# Patient Record
Sex: Female | Born: 1945 | Race: White | Hispanic: No | Smoking: Never smoker
Health system: Southern US, Community
[De-identification: ages and names within clinical notes are randomized; demographics above are authoritative.]

## PROBLEM LIST (undated history)

## (undated) DIAGNOSIS — Z86010 Personal history of colonic polyps: Secondary | ICD-10-CM

## (undated) DIAGNOSIS — K222 Esophageal obstruction: Secondary | ICD-10-CM

## (undated) DIAGNOSIS — M1990 Unspecified osteoarthritis, unspecified site: Secondary | ICD-10-CM

## (undated) DIAGNOSIS — E785 Hyperlipidemia, unspecified: Secondary | ICD-10-CM

## (undated) HISTORY — PX: COLONOSCOPY: SHX174

## (undated) HISTORY — PX: ESOPHAGOGASTRODUODENOSCOPY: SHX1529

## (undated) HISTORY — PX: CHOLECYSTECTOMY: SHX55

---

## 2009-06-21 ENCOUNTER — Ambulatory Visit: Payer: Self-pay | Admitting: Internal Medicine

## 2010-06-22 ENCOUNTER — Ambulatory Visit: Payer: Self-pay | Admitting: Internal Medicine

## 2011-06-28 ENCOUNTER — Ambulatory Visit: Payer: Self-pay | Admitting: Internal Medicine

## 2012-07-15 ENCOUNTER — Ambulatory Visit: Payer: Self-pay | Admitting: Internal Medicine

## 2012-08-11 ENCOUNTER — Ambulatory Visit: Payer: Self-pay | Admitting: Unknown Physician Specialty

## 2012-08-11 LAB — CBC WITH DIFFERENTIAL/PLATELET
Basophil %: 1 %
Eosinophil #: 0.2 10*3/uL (ref 0.0–0.7)
Eosinophil %: 2.2 %
HCT: 40.7 % (ref 35.0–47.0)
HGB: 13.6 g/dL (ref 12.0–16.0)
Lymphocyte #: 2.7 10*3/uL (ref 1.0–3.6)
Lymphocyte %: 33.3 %
MCHC: 33.3 g/dL (ref 32.0–36.0)
MCV: 86 fL (ref 80–100)
Monocyte #: 0.7 x10 3/mm (ref 0.2–0.9)
Monocyte %: 8.5 %
Neutrophil #: 4.5 10*3/uL (ref 1.4–6.5)
Neutrophil %: 55 %
Platelet: 380 10*3/uL (ref 150–440)
WBC: 8.1 10*3/uL (ref 3.6–11.0)

## 2012-08-11 LAB — PROTIME-INR: Prothrombin Time: 12.4 secs (ref 11.5–14.7)

## 2012-08-12 LAB — PATHOLOGY REPORT

## 2013-07-19 ENCOUNTER — Ambulatory Visit: Payer: Self-pay

## 2014-08-23 ENCOUNTER — Ambulatory Visit: Payer: Self-pay | Admitting: Internal Medicine

## 2015-03-20 DIAGNOSIS — H1033 Unspecified acute conjunctivitis, bilateral: Secondary | ICD-10-CM | POA: Diagnosis not present

## 2015-03-20 DIAGNOSIS — J988 Other specified respiratory disorders: Secondary | ICD-10-CM | POA: Diagnosis not present

## 2015-05-26 DIAGNOSIS — E780 Pure hypercholesterolemia: Secondary | ICD-10-CM | POA: Diagnosis not present

## 2015-05-26 DIAGNOSIS — M159 Polyosteoarthritis, unspecified: Secondary | ICD-10-CM | POA: Diagnosis not present

## 2015-06-02 DIAGNOSIS — M170 Bilateral primary osteoarthritis of knee: Secondary | ICD-10-CM | POA: Diagnosis not present

## 2015-06-02 DIAGNOSIS — E780 Pure hypercholesterolemia: Secondary | ICD-10-CM | POA: Diagnosis not present

## 2015-08-17 DIAGNOSIS — Z86010 Personal history of colonic polyps: Secondary | ICD-10-CM | POA: Diagnosis not present

## 2015-09-04 DIAGNOSIS — I10 Essential (primary) hypertension: Secondary | ICD-10-CM | POA: Diagnosis not present

## 2015-09-04 DIAGNOSIS — F411 Generalized anxiety disorder: Secondary | ICD-10-CM | POA: Diagnosis not present

## 2015-09-04 DIAGNOSIS — M170 Bilateral primary osteoarthritis of knee: Secondary | ICD-10-CM | POA: Diagnosis not present

## 2015-09-15 ENCOUNTER — Encounter: Payer: Self-pay | Admitting: *Deleted

## 2015-09-18 ENCOUNTER — Ambulatory Visit: Payer: Commercial Managed Care - HMO | Admitting: *Deleted

## 2015-09-18 ENCOUNTER — Ambulatory Visit
Admission: RE | Admit: 2015-09-18 | Discharge: 2015-09-18 | Disposition: A | Discharge status: Home / Self Care | Payer: Commercial Managed Care - HMO | Source: Ambulatory Visit | Attending: Unknown Physician Specialty | Admitting: Unknown Physician Specialty

## 2015-09-18 ENCOUNTER — Encounter: Payer: Self-pay | Admitting: *Deleted

## 2015-09-18 ENCOUNTER — Encounter
Admission: RE | Disposition: A | Discharge status: Home / Self Care | Payer: Self-pay | Source: Ambulatory Visit | Attending: Unknown Physician Specialty

## 2015-09-18 DIAGNOSIS — Z9049 Acquired absence of other specified parts of digestive tract: Secondary | ICD-10-CM | POA: Insufficient documentation

## 2015-09-18 DIAGNOSIS — K5730 Diverticulosis of large intestine without perforation or abscess without bleeding: Secondary | ICD-10-CM | POA: Insufficient documentation

## 2015-09-18 DIAGNOSIS — D123 Benign neoplasm of transverse colon: Secondary | ICD-10-CM | POA: Insufficient documentation

## 2015-09-18 DIAGNOSIS — Z6829 Body mass index (BMI) 29.0-29.9, adult: Secondary | ICD-10-CM | POA: Diagnosis not present

## 2015-09-18 DIAGNOSIS — I10 Essential (primary) hypertension: Secondary | ICD-10-CM | POA: Insufficient documentation

## 2015-09-18 DIAGNOSIS — Z79899 Other long term (current) drug therapy: Secondary | ICD-10-CM | POA: Diagnosis not present

## 2015-09-18 DIAGNOSIS — K648 Other hemorrhoids: Secondary | ICD-10-CM | POA: Diagnosis not present

## 2015-09-18 DIAGNOSIS — M1990 Unspecified osteoarthritis, unspecified site: Secondary | ICD-10-CM | POA: Insufficient documentation

## 2015-09-18 DIAGNOSIS — K640 First degree hemorrhoids: Secondary | ICD-10-CM | POA: Diagnosis not present

## 2015-09-18 DIAGNOSIS — Z86010 Personal history of colonic polyps: Secondary | ICD-10-CM | POA: Diagnosis not present

## 2015-09-18 DIAGNOSIS — E785 Hyperlipidemia, unspecified: Secondary | ICD-10-CM | POA: Insufficient documentation

## 2015-09-18 DIAGNOSIS — E669 Obesity, unspecified: Secondary | ICD-10-CM | POA: Insufficient documentation

## 2015-09-18 DIAGNOSIS — E119 Type 2 diabetes mellitus without complications: Secondary | ICD-10-CM | POA: Diagnosis not present

## 2015-09-18 DIAGNOSIS — K5790 Diverticulosis of intestine, part unspecified, without perforation or abscess without bleeding: Secondary | ICD-10-CM | POA: Diagnosis not present

## 2015-09-18 DIAGNOSIS — K635 Polyp of colon: Secondary | ICD-10-CM | POA: Diagnosis not present

## 2015-09-18 HISTORY — DX: Unspecified osteoarthritis, unspecified site: M19.90

## 2015-09-18 HISTORY — DX: Hyperlipidemia, unspecified: E78.5

## 2015-09-18 HISTORY — DX: Personal history of colonic polyps: Z86.010

## 2015-09-18 HISTORY — PX: COLONOSCOPY WITH PROPOFOL: SHX5780

## 2015-09-18 HISTORY — DX: Esophageal obstruction: K22.2

## 2015-09-18 LAB — GLUCOSE, CAPILLARY: Glucose-Capillary: 91 mg/dL (ref 65–99)

## 2015-09-18 SURGERY — COLONOSCOPY WITH PROPOFOL
Anesthesia: General

## 2015-09-18 MED ORDER — FENTANYL CITRATE (PF) 100 MCG/2ML IJ SOLN
INTRAMUSCULAR | Status: DC | PRN
  Administered 2015-09-18: 50 ug via INTRAVENOUS

## 2015-09-18 MED ORDER — PROPOFOL 10 MG/ML IV BOLUS
INTRAVENOUS | Status: DC | PRN
  Administered 2015-09-18: 14 mg via INTRAVENOUS
  Administered 2015-09-18: 35 mg via INTRAVENOUS

## 2015-09-18 MED ORDER — PROPOFOL 500 MG/50ML IV EMUL
INTRAVENOUS | Status: DC | PRN
  Administered 2015-09-18: 100 ug/kg/min via INTRAVENOUS

## 2015-09-18 MED ORDER — SODIUM CHLORIDE 0.9 % IV SOLN: INTRAVENOUS | Status: DC

## 2015-09-18 MED ORDER — SODIUM CHLORIDE 0.9 % IV SOLN
INTRAVENOUS | Status: DC
  Administered 2015-09-18: 13:00:00 via INTRAVENOUS

## 2015-09-18 NOTE — Anesthesia Preprocedure Evaluation (Signed)
Anesthesia Evaluation  Patient identified by MRN, date of birth, ID band Patient awake    Reviewed: Allergy & Precautions, NPO status , Patient's Chart, lab work & pertinent test results  Airway Mallampati: II  TM Distance: >3 FB Neck ROM: Limited    Dental  (+) Upper Dentures, Lower Dentures   Pulmonary    Pulmonary exam normal        Cardiovascular Exercise Tolerance: Good hypertension, Pt. on medications Normal cardiovascular exam     Neuro/Psych    GI/Hepatic   Endo/Other  diabetes, Type 2Diet controlled DM--BG 91.  Renal/GU      Musculoskeletal  (+) Arthritis , Osteoarthritis,    Abdominal (+) + obese,  Abdomen: soft.    Peds  Hematology   Anesthesia Other Findings   Reproductive/Obstetrics                             Anesthesia Physical Anesthesia Plan  ASA: III  Anesthesia Plan: General   Post-op Pain Management:    Induction: Intravenous  Airway Management Planned: Nasal Cannula  Additional Equipment:   Intra-op Plan:   Post-operative Plan:   Informed Consent: I have reviewed the patients History and Physical, chart, labs and discussed the procedure including the risks, benefits and alternatives for the proposed anesthesia with the patient or authorized representative who has indicated his/her understanding and acceptance.     Plan Discussed with: CRNA  Anesthesia Plan Comments:         Anesthesia Quick Evaluation

## 2015-09-18 NOTE — Transfer of Care (Signed)
Immediate Anesthesia Transfer of Care Note  Patient: Jeanne Bennett  Procedure(s) Performed: Procedure(s): COLONOSCOPY WITH PROPOFOL (N/A)  Patient Location: PACU  Anesthesia Type:General  Level of Consciousness: sedated  Airway & Oxygen Therapy: Patient Spontanous Breathing and Patient connected to nasal cannula oxygen  Post-op Assessment: Report given to RN and Post -op Vital signs reviewed and stable  Post vital signs: Reviewed and stable  Last Vitals:  Filed Vitals:   09/18/15 1347  BP: 112/66  Pulse:   Temp: 36.1 C  Resp: 17    Complications: No apparent anesthesia complications

## 2015-09-18 NOTE — H&P (Signed)
   Primary Care Physician:  Tracie Harrier, MD Primary Gastroenterologist:  Dr. Vira Agar  Pre-Procedure History & Physical: HPI:  Jeanne Bennett is a 69 y.o. female is here for an colonoscopy.   Past Medical History  Diagnosis Date  . Arthritis   . Hyperlipidemia   . Hx of adenomatous colonic polyps   . Schatzki's ring     Past Surgical History  Procedure Laterality Date  . Cholecystectomy    . Colonoscopy    . Esophagogastroduodenoscopy      Prior to Admission medications   Medication Sig Start Date End Date Taking? Authorizing Jereld Presti  amLODipine (NORVASC) 2.5 MG tablet Take 2.5 mg by mouth daily.   Yes Historical Aleyah Balik, MD  calcium carbonate (OSCAL) 1500 (600 CA) MG TABS tablet Take by mouth 2 (two) times daily with a meal.   Yes Historical Gloyd Happ, MD  Flaxseed, Linseed, (FLAXSEED OIL) 1000 MG CAPS Take 1,000 mg by mouth.   Yes Historical Dandrae Kustra, MD  Garlic 081 MG TABS Take 1,000 mg by mouth.   Yes Historical Leonda Cristo, MD  lovastatin (MEVACOR) 40 MG tablet Take 40 mg by mouth at bedtime.   Yes Historical Aliene Tamura, MD  vitamin E 400 UNIT capsule Take 400 Units by mouth daily.   Yes Historical Franki Alcaide, MD    Allergies as of 08/18/2015  . (Not on File)    History reviewed. No pertinent family history.  Social History   Social History  . Marital Status: Widowed    Spouse Name: N/A  . Number of Children: N/A  . Years of Education: N/A   Occupational History  . Not on file.   Social History Main Topics  . Smoking status: Never Smoker   . Smokeless tobacco: Never Used  . Alcohol Use: No  . Drug Use: No  . Sexual Activity: Not on file   Other Topics Concern  . Not on file   Social History Narrative    Review of Systems: See HPI, otherwise negative ROS  Physical Exam: BP 153/86 mmHg  Pulse 68  Temp(Src) 97.7 F (36.5 C) (Tympanic)  Resp 14  Ht 5\' 2"  (1.575 m)  Wt 72.576 kg (160 lb)  BMI 29.26 kg/m2  SpO2 100% General:   Alert,   pleasant and cooperative in NAD Head:  Normocephalic and atraumatic. Neck:  Supple; no masses or thyromegaly. Lungs:  Clear throughout to auscultation.    Heart:  Regular rate and rhythm. Abdomen:  Soft, nontender and nondistended. Normal bowel sounds, without guarding, and without rebound.   Neurologic:  Alert and  oriented x4;  grossly normal neurologically.  Impression/Plan: [name] is here for an colonoscopy to be performed for [facility] colon polyps  Risks, benefits, limitations, and alternatives regarding  colonoscopy have been reviewed with the patient.  Questions have been answered.  All parties agreeable.   Gaylyn Cheers, MD  09/18/2015, 1:21 PM

## 2015-09-18 NOTE — Anesthesia Postprocedure Evaluation (Signed)
  Anesthesia Post-op Note  Patient: Jeanne Bennett  Procedure(s) Performed: Procedure(s): COLONOSCOPY WITH PROPOFOL (N/A)  Anesthesia type:General  Patient location: PACU  Post pain: Pain level controlled  Post assessment: Post-op Vital signs reviewed, Patient's Cardiovascular Status Stable, Respiratory Function Stable, Patent Airway and No signs of Nausea or vomiting  Post vital signs: Reviewed and stable  Last Vitals:  Filed Vitals:   09/18/15 1358  BP: 132/83  Pulse: 35  Temp:   Resp: 13    Level of consciousness: awake, alert  and patient cooperative  Complications: No apparent anesthesia complications

## 2015-09-18 NOTE — Op Note (Signed)
[facility] Patient Name: Jeanne Bennett Procedure Date: 09/18/2015 1:18 PM MRN: [id number] Account #: [id number] Date of Birth: 08/06/1946 Admit Type: Outpatient Age: 69 Room: [facility] ENDO ROOM 1 Gender: Female Note Status: Finalized Procedure:         Colonoscopy Indications:       Personal history of colonic polyps Providers:         Manya Silvas, MD Referring MD:      Tracie Harrier, MD (Referring MD) Medicines:         Propofol per Anesthesia Complications:     No immediate complications. Procedure:         Pre-Anesthesia Assessment:                    - After reviewing the risks and benefits, the patient was                     deemed in satisfactory condition to undergo the procedure.                    After obtaining informed consent, the colonoscope was                     passed under direct vision. Throughout the procedure, the                     patient's blood pressure, pulse, and oxygen saturations                     were monitored continuously. The Colonoscope was                     introduced through the anus and advanced to the the cecum,                     identified by appendiceal orifice and ileocecal valve. The                     colonoscopy was performed without difficulty. The patient                     tolerated the procedure well. The quality of the bowel                     preparation was excellent. Findings:      A diminutive polyp was found in the transverse colon. The polyp was       sessile. The polyp was removed with a jumbo cold forceps. Resection and       retrieval were complete.      Multiple small and large-mouthed diverticula were found in the sigmoid       colon and in the descending colon.      Internal hemorrhoids were found during endoscopy. The hemorrhoids were       small and Grade I (internal hemorrhoids that do not prolapse).      The exam was otherwise without abnormality. Impression:         - One diminutive polyp in the transverse colon. Resected                     and retrieved.                    - Diverticulosis in the sigmoid colon and in the  descending colon.                    - Internal hemorrhoids.                    - The examination was otherwise normal. Recommendation:    - Await pathology results. Manya Silvas, MD 09/18/2015 1:48:45 PM This report has been signed electronically. Number of Addenda: 0 Note Initiated On: 09/18/2015 1:18 PM Scope Withdrawal Time: 0 hours 10 minutes 8 seconds  Total Procedure Duration: 0 hours 16 minutes 29 seconds       [facility]

## 2015-09-19 ENCOUNTER — Encounter: Payer: Self-pay | Admitting: Unknown Physician Specialty

## 2015-09-19 LAB — SURGICAL PATHOLOGY

## 2015-09-28 DIAGNOSIS — H5210 Myopia, unspecified eye: Secondary | ICD-10-CM | POA: Diagnosis not present

## 2015-09-28 DIAGNOSIS — H524 Presbyopia: Secondary | ICD-10-CM | POA: Diagnosis not present

## 2015-11-24 DIAGNOSIS — E7800 Pure hypercholesterolemia, unspecified: Secondary | ICD-10-CM | POA: Diagnosis not present

## 2015-11-24 DIAGNOSIS — M170 Bilateral primary osteoarthritis of knee: Secondary | ICD-10-CM | POA: Diagnosis not present

## 2015-12-01 DIAGNOSIS — E7800 Pure hypercholesterolemia, unspecified: Secondary | ICD-10-CM | POA: Diagnosis not present

## 2015-12-01 DIAGNOSIS — Z86010 Personal history of colonic polyps: Secondary | ICD-10-CM | POA: Diagnosis not present

## 2015-12-01 DIAGNOSIS — M170 Bilateral primary osteoarthritis of knee: Secondary | ICD-10-CM | POA: Diagnosis not present

## 2015-12-01 DIAGNOSIS — Z0000 Encounter for general adult medical examination without abnormal findings: Secondary | ICD-10-CM | POA: Diagnosis not present

## 2015-12-01 DIAGNOSIS — F411 Generalized anxiety disorder: Secondary | ICD-10-CM | POA: Diagnosis not present

## 2015-12-13 DIAGNOSIS — R8299 Other abnormal findings in urine: Secondary | ICD-10-CM | POA: Diagnosis not present

## 2016-03-08 ENCOUNTER — Other Ambulatory Visit: Payer: Self-pay | Admitting: Internal Medicine

## 2016-03-08 DIAGNOSIS — Z1231 Encounter for screening mammogram for malignant neoplasm of breast: Secondary | ICD-10-CM

## 2016-03-21 ENCOUNTER — Ambulatory Visit
Admission: RE | Admit: 2016-03-21 | Discharge: 2016-03-21 | Disposition: A | Discharge status: Home / Self Care | Payer: Commercial Managed Care - HMO | Source: Ambulatory Visit | Attending: Internal Medicine | Admitting: Internal Medicine

## 2016-03-21 ENCOUNTER — Other Ambulatory Visit: Payer: Self-pay | Admitting: Internal Medicine

## 2016-03-21 DIAGNOSIS — Z1231 Encounter for screening mammogram for malignant neoplasm of breast: Secondary | ICD-10-CM | POA: Diagnosis not present

## 2016-05-06 DIAGNOSIS — J40 Bronchitis, not specified as acute or chronic: Secondary | ICD-10-CM | POA: Diagnosis not present

## 2016-05-24 DIAGNOSIS — Z0000 Encounter for general adult medical examination without abnormal findings: Secondary | ICD-10-CM | POA: Diagnosis not present

## 2016-05-24 DIAGNOSIS — M170 Bilateral primary osteoarthritis of knee: Secondary | ICD-10-CM | POA: Diagnosis not present

## 2016-05-24 DIAGNOSIS — E7800 Pure hypercholesterolemia, unspecified: Secondary | ICD-10-CM | POA: Diagnosis not present

## 2016-05-24 DIAGNOSIS — Z86010 Personal history of colonic polyps: Secondary | ICD-10-CM | POA: Diagnosis not present

## 2016-05-24 DIAGNOSIS — F411 Generalized anxiety disorder: Secondary | ICD-10-CM | POA: Diagnosis not present

## 2016-05-31 DIAGNOSIS — M170 Bilateral primary osteoarthritis of knee: Secondary | ICD-10-CM | POA: Diagnosis not present

## 2016-05-31 DIAGNOSIS — I10 Essential (primary) hypertension: Secondary | ICD-10-CM | POA: Diagnosis not present

## 2016-05-31 DIAGNOSIS — F411 Generalized anxiety disorder: Secondary | ICD-10-CM | POA: Diagnosis not present

## 2016-05-31 DIAGNOSIS — E782 Mixed hyperlipidemia: Secondary | ICD-10-CM | POA: Diagnosis not present

## 2016-11-04 DIAGNOSIS — H00022 Hordeolum internum right lower eyelid: Secondary | ICD-10-CM | POA: Diagnosis not present

## 2016-11-26 DIAGNOSIS — I10 Essential (primary) hypertension: Secondary | ICD-10-CM | POA: Diagnosis not present

## 2016-11-26 DIAGNOSIS — M170 Bilateral primary osteoarthritis of knee: Secondary | ICD-10-CM | POA: Diagnosis not present

## 2016-11-26 DIAGNOSIS — F411 Generalized anxiety disorder: Secondary | ICD-10-CM | POA: Diagnosis not present

## 2016-12-03 DIAGNOSIS — M170 Bilateral primary osteoarthritis of knee: Secondary | ICD-10-CM | POA: Diagnosis not present

## 2016-12-03 DIAGNOSIS — E7800 Pure hypercholesterolemia, unspecified: Secondary | ICD-10-CM | POA: Diagnosis not present

## 2016-12-03 DIAGNOSIS — F439 Reaction to severe stress, unspecified: Secondary | ICD-10-CM | POA: Diagnosis not present

## 2016-12-03 DIAGNOSIS — Z0000 Encounter for general adult medical examination without abnormal findings: Secondary | ICD-10-CM | POA: Diagnosis not present

## 2016-12-03 DIAGNOSIS — I10 Essential (primary) hypertension: Secondary | ICD-10-CM | POA: Diagnosis not present

## 2017-02-12 ENCOUNTER — Other Ambulatory Visit: Payer: Self-pay | Admitting: Internal Medicine

## 2017-02-12 DIAGNOSIS — Z1231 Encounter for screening mammogram for malignant neoplasm of breast: Secondary | ICD-10-CM

## 2017-03-27 ENCOUNTER — Ambulatory Visit
Admission: RE | Admit: 2017-03-27 | Discharge: 2017-03-27 | Disposition: A | Discharge status: Home / Self Care | Payer: Medicare HMO | Source: Ambulatory Visit | Attending: Internal Medicine | Admitting: Internal Medicine

## 2017-03-27 DIAGNOSIS — Z1231 Encounter for screening mammogram for malignant neoplasm of breast: Secondary | ICD-10-CM | POA: Insufficient documentation

## 2017-05-27 DIAGNOSIS — Z0000 Encounter for general adult medical examination without abnormal findings: Secondary | ICD-10-CM | POA: Diagnosis not present

## 2017-05-27 DIAGNOSIS — E7800 Pure hypercholesterolemia, unspecified: Secondary | ICD-10-CM | POA: Diagnosis not present

## 2017-05-27 DIAGNOSIS — F439 Reaction to severe stress, unspecified: Secondary | ICD-10-CM | POA: Diagnosis not present

## 2017-05-27 DIAGNOSIS — M170 Bilateral primary osteoarthritis of knee: Secondary | ICD-10-CM | POA: Diagnosis not present

## 2017-05-27 DIAGNOSIS — I10 Essential (primary) hypertension: Secondary | ICD-10-CM | POA: Diagnosis not present

## 2017-06-03 DIAGNOSIS — I10 Essential (primary) hypertension: Secondary | ICD-10-CM | POA: Diagnosis not present

## 2017-06-03 DIAGNOSIS — F411 Generalized anxiety disorder: Secondary | ICD-10-CM | POA: Diagnosis not present

## 2017-06-03 DIAGNOSIS — E7800 Pure hypercholesterolemia, unspecified: Secondary | ICD-10-CM | POA: Diagnosis not present

## 2017-06-03 DIAGNOSIS — Z0000 Encounter for general adult medical examination without abnormal findings: Secondary | ICD-10-CM | POA: Diagnosis not present

## 2017-06-03 DIAGNOSIS — M170 Bilateral primary osteoarthritis of knee: Secondary | ICD-10-CM | POA: Diagnosis not present

## 2017-07-08 DIAGNOSIS — H524 Presbyopia: Secondary | ICD-10-CM | POA: Diagnosis not present

## 2017-11-27 DIAGNOSIS — M170 Bilateral primary osteoarthritis of knee: Secondary | ICD-10-CM | POA: Diagnosis not present

## 2017-11-27 DIAGNOSIS — E7800 Pure hypercholesterolemia, unspecified: Secondary | ICD-10-CM | POA: Diagnosis not present

## 2017-11-27 DIAGNOSIS — F411 Generalized anxiety disorder: Secondary | ICD-10-CM | POA: Diagnosis not present

## 2017-11-27 DIAGNOSIS — I10 Essential (primary) hypertension: Secondary | ICD-10-CM | POA: Diagnosis not present

## 2018-01-19 ENCOUNTER — Other Ambulatory Visit: Payer: Self-pay | Admitting: Internal Medicine

## 2018-01-19 DIAGNOSIS — Z0000 Encounter for general adult medical examination without abnormal findings: Secondary | ICD-10-CM | POA: Diagnosis not present

## 2018-01-19 DIAGNOSIS — Z1231 Encounter for screening mammogram for malignant neoplasm of breast: Secondary | ICD-10-CM | POA: Diagnosis not present

## 2018-01-19 DIAGNOSIS — L0390 Cellulitis, unspecified: Secondary | ICD-10-CM | POA: Diagnosis not present

## 2018-01-19 DIAGNOSIS — Z1239 Encounter for other screening for malignant neoplasm of breast: Secondary | ICD-10-CM

## 2018-01-19 DIAGNOSIS — E782 Mixed hyperlipidemia: Secondary | ICD-10-CM | POA: Diagnosis not present

## 2018-01-19 DIAGNOSIS — I10 Essential (primary) hypertension: Secondary | ICD-10-CM | POA: Diagnosis not present

## 2018-01-19 DIAGNOSIS — F411 Generalized anxiety disorder: Secondary | ICD-10-CM | POA: Diagnosis not present

## 2018-01-19 DIAGNOSIS — M170 Bilateral primary osteoarthritis of knee: Secondary | ICD-10-CM | POA: Diagnosis not present

## 2018-03-31 ENCOUNTER — Ambulatory Visit
Admission: RE | Admit: 2018-03-31 | Discharge: 2018-03-31 | Disposition: A | Discharge status: Home / Self Care | Payer: Medicare HMO | Source: Ambulatory Visit | Attending: Internal Medicine | Admitting: Internal Medicine

## 2018-03-31 DIAGNOSIS — Z1239 Encounter for other screening for malignant neoplasm of breast: Secondary | ICD-10-CM

## 2018-03-31 DIAGNOSIS — Z1231 Encounter for screening mammogram for malignant neoplasm of breast: Secondary | ICD-10-CM | POA: Diagnosis not present

## 2018-07-23 DIAGNOSIS — I10 Essential (primary) hypertension: Secondary | ICD-10-CM | POA: Diagnosis not present

## 2018-07-23 DIAGNOSIS — E782 Mixed hyperlipidemia: Secondary | ICD-10-CM | POA: Diagnosis not present

## 2018-07-23 DIAGNOSIS — F411 Generalized anxiety disorder: Secondary | ICD-10-CM | POA: Diagnosis not present

## 2018-07-23 DIAGNOSIS — M170 Bilateral primary osteoarthritis of knee: Secondary | ICD-10-CM | POA: Diagnosis not present

## 2018-08-27 DIAGNOSIS — F411 Generalized anxiety disorder: Secondary | ICD-10-CM | POA: Diagnosis not present

## 2018-08-27 DIAGNOSIS — I10 Essential (primary) hypertension: Secondary | ICD-10-CM | POA: Diagnosis not present

## 2018-08-27 DIAGNOSIS — E782 Mixed hyperlipidemia: Secondary | ICD-10-CM | POA: Diagnosis not present

## 2018-08-27 DIAGNOSIS — M170 Bilateral primary osteoarthritis of knee: Secondary | ICD-10-CM | POA: Diagnosis not present

## 2018-08-27 DIAGNOSIS — Z0000 Encounter for general adult medical examination without abnormal findings: Secondary | ICD-10-CM | POA: Diagnosis not present

## 2018-09-10 DIAGNOSIS — H524 Presbyopia: Secondary | ICD-10-CM | POA: Diagnosis not present

## 2018-11-12 DIAGNOSIS — L723 Sebaceous cyst: Secondary | ICD-10-CM | POA: Diagnosis not present

## 2019-03-22 DIAGNOSIS — M170 Bilateral primary osteoarthritis of knee: Secondary | ICD-10-CM | POA: Diagnosis not present

## 2019-03-22 DIAGNOSIS — E782 Mixed hyperlipidemia: Secondary | ICD-10-CM | POA: Diagnosis not present

## 2019-03-22 DIAGNOSIS — I10 Essential (primary) hypertension: Secondary | ICD-10-CM | POA: Diagnosis not present

## 2019-03-22 DIAGNOSIS — F411 Generalized anxiety disorder: Secondary | ICD-10-CM | POA: Diagnosis not present

## 2019-03-29 ENCOUNTER — Other Ambulatory Visit: Payer: Self-pay | Admitting: Internal Medicine

## 2019-03-29 DIAGNOSIS — I10 Essential (primary) hypertension: Secondary | ICD-10-CM | POA: Diagnosis not present

## 2019-03-29 DIAGNOSIS — Z1239 Encounter for other screening for malignant neoplasm of breast: Secondary | ICD-10-CM | POA: Diagnosis not present

## 2019-03-29 DIAGNOSIS — E782 Mixed hyperlipidemia: Secondary | ICD-10-CM | POA: Diagnosis not present

## 2019-03-29 DIAGNOSIS — F411 Generalized anxiety disorder: Secondary | ICD-10-CM | POA: Diagnosis not present

## 2019-03-29 DIAGNOSIS — M170 Bilateral primary osteoarthritis of knee: Secondary | ICD-10-CM | POA: Diagnosis not present

## 2019-03-29 DIAGNOSIS — Z0000 Encounter for general adult medical examination without abnormal findings: Secondary | ICD-10-CM | POA: Diagnosis not present

## 2019-03-29 DIAGNOSIS — Z1231 Encounter for screening mammogram for malignant neoplasm of breast: Secondary | ICD-10-CM

## 2019-06-10 ENCOUNTER — Other Ambulatory Visit: Payer: Self-pay

## 2019-06-10 ENCOUNTER — Ambulatory Visit
Admission: RE | Admit: 2019-06-10 | Discharge: 2019-06-10 | Disposition: A | Discharge status: Home / Self Care | Payer: Medicare HMO | Source: Ambulatory Visit | Attending: Internal Medicine | Admitting: Internal Medicine

## 2019-06-10 DIAGNOSIS — Z1231 Encounter for screening mammogram for malignant neoplasm of breast: Secondary | ICD-10-CM | POA: Insufficient documentation

## 2019-06-15 ENCOUNTER — Other Ambulatory Visit: Payer: Self-pay | Admitting: Internal Medicine

## 2019-06-15 DIAGNOSIS — R928 Other abnormal and inconclusive findings on diagnostic imaging of breast: Secondary | ICD-10-CM

## 2019-06-15 DIAGNOSIS — N6489 Other specified disorders of breast: Secondary | ICD-10-CM

## 2019-06-21 ENCOUNTER — Other Ambulatory Visit: Payer: Self-pay

## 2019-06-21 ENCOUNTER — Ambulatory Visit
Admission: RE | Admit: 2019-06-21 | Discharge: 2019-06-21 | Disposition: A | Discharge status: Home / Self Care | Payer: Medicare HMO | Source: Ambulatory Visit | Attending: Internal Medicine | Admitting: Internal Medicine

## 2019-06-21 DIAGNOSIS — R928 Other abnormal and inconclusive findings on diagnostic imaging of breast: Secondary | ICD-10-CM | POA: Insufficient documentation

## 2019-06-21 DIAGNOSIS — N6312 Unspecified lump in the right breast, upper inner quadrant: Secondary | ICD-10-CM | POA: Diagnosis not present

## 2019-06-21 DIAGNOSIS — N6489 Other specified disorders of breast: Secondary | ICD-10-CM | POA: Insufficient documentation

## 2019-06-28 ENCOUNTER — Other Ambulatory Visit: Payer: Self-pay | Admitting: Internal Medicine

## 2019-06-28 DIAGNOSIS — N63 Unspecified lump in unspecified breast: Secondary | ICD-10-CM

## 2019-09-08 DIAGNOSIS — H524 Presbyopia: Secondary | ICD-10-CM | POA: Diagnosis not present

## 2019-09-08 DIAGNOSIS — Z0100 Encounter for examination of eyes and vision without abnormal findings: Secondary | ICD-10-CM | POA: Diagnosis not present

## 2019-09-20 DIAGNOSIS — M170 Bilateral primary osteoarthritis of knee: Secondary | ICD-10-CM | POA: Diagnosis not present

## 2019-09-20 DIAGNOSIS — I10 Essential (primary) hypertension: Secondary | ICD-10-CM | POA: Diagnosis not present

## 2019-09-20 DIAGNOSIS — Z0000 Encounter for general adult medical examination without abnormal findings: Secondary | ICD-10-CM | POA: Diagnosis not present

## 2019-09-20 DIAGNOSIS — E782 Mixed hyperlipidemia: Secondary | ICD-10-CM | POA: Diagnosis not present

## 2019-09-20 DIAGNOSIS — F411 Generalized anxiety disorder: Secondary | ICD-10-CM | POA: Diagnosis not present

## 2019-09-27 DIAGNOSIS — I10 Essential (primary) hypertension: Secondary | ICD-10-CM | POA: Diagnosis not present

## 2019-09-27 DIAGNOSIS — R457 State of emotional shock and stress, unspecified: Secondary | ICD-10-CM | POA: Diagnosis not present

## 2019-09-27 DIAGNOSIS — Z79899 Other long term (current) drug therapy: Secondary | ICD-10-CM | POA: Diagnosis not present

## 2019-09-27 DIAGNOSIS — F411 Generalized anxiety disorder: Secondary | ICD-10-CM | POA: Diagnosis not present

## 2019-09-27 DIAGNOSIS — E782 Mixed hyperlipidemia: Secondary | ICD-10-CM | POA: Diagnosis not present

## 2019-10-04 DIAGNOSIS — Z780 Asymptomatic menopausal state: Secondary | ICD-10-CM | POA: Diagnosis not present

## 2019-11-17 ENCOUNTER — Other Ambulatory Visit: Payer: Self-pay | Admitting: Internal Medicine

## 2019-11-17 DIAGNOSIS — N63 Unspecified lump in unspecified breast: Secondary | ICD-10-CM

## 2020-01-12 ENCOUNTER — Other Ambulatory Visit: Payer: Medicare HMO

## 2020-01-12 ENCOUNTER — Ambulatory Visit: Payer: Medicare HMO

## 2020-01-18 ENCOUNTER — Ambulatory Visit
Admission: RE | Admit: 2020-01-18 | Discharge: 2020-01-18 | Disposition: A | Discharge status: Home / Self Care | Payer: Medicare HMO | Source: Ambulatory Visit | Attending: Internal Medicine | Admitting: Internal Medicine

## 2020-01-18 DIAGNOSIS — N6341 Unspecified lump in right breast, subareolar: Secondary | ICD-10-CM | POA: Diagnosis not present

## 2020-01-18 DIAGNOSIS — N63 Unspecified lump in unspecified breast: Secondary | ICD-10-CM

## 2020-01-18 DIAGNOSIS — N6310 Unspecified lump in the right breast, unspecified quadrant: Secondary | ICD-10-CM | POA: Diagnosis not present

## 2020-01-18 DIAGNOSIS — R928 Other abnormal and inconclusive findings on diagnostic imaging of breast: Secondary | ICD-10-CM | POA: Diagnosis not present

## 2020-01-20 ENCOUNTER — Other Ambulatory Visit: Payer: Self-pay | Admitting: Internal Medicine

## 2020-01-20 DIAGNOSIS — N6310 Unspecified lump in the right breast, unspecified quadrant: Secondary | ICD-10-CM

## 2020-03-28 DIAGNOSIS — R457 State of emotional shock and stress, unspecified: Secondary | ICD-10-CM | POA: Diagnosis not present

## 2020-03-28 DIAGNOSIS — I10 Essential (primary) hypertension: Secondary | ICD-10-CM | POA: Diagnosis not present

## 2020-03-28 DIAGNOSIS — E782 Mixed hyperlipidemia: Secondary | ICD-10-CM | POA: Diagnosis not present

## 2020-03-28 DIAGNOSIS — F411 Generalized anxiety disorder: Secondary | ICD-10-CM | POA: Diagnosis not present

## 2020-03-28 DIAGNOSIS — R8290 Unspecified abnormal findings in urine: Secondary | ICD-10-CM | POA: Diagnosis not present

## 2020-03-29 DIAGNOSIS — Z79899 Other long term (current) drug therapy: Secondary | ICD-10-CM | POA: Diagnosis not present

## 2020-03-29 DIAGNOSIS — Z0000 Encounter for general adult medical examination without abnormal findings: Secondary | ICD-10-CM | POA: Diagnosis not present

## 2020-03-29 DIAGNOSIS — K222 Esophageal obstruction: Secondary | ICD-10-CM | POA: Diagnosis not present

## 2020-03-29 DIAGNOSIS — I10 Essential (primary) hypertension: Secondary | ICD-10-CM | POA: Diagnosis not present

## 2020-03-29 DIAGNOSIS — E785 Hyperlipidemia, unspecified: Secondary | ICD-10-CM | POA: Diagnosis not present

## 2020-03-29 DIAGNOSIS — M170 Bilateral primary osteoarthritis of knee: Secondary | ICD-10-CM | POA: Diagnosis not present

## 2020-03-29 DIAGNOSIS — F419 Anxiety disorder, unspecified: Secondary | ICD-10-CM | POA: Diagnosis not present

## 2020-03-29 DIAGNOSIS — Z86010 Personal history of colonic polyps: Secondary | ICD-10-CM | POA: Diagnosis not present

## 2020-04-14 DIAGNOSIS — S52502A Unspecified fracture of the lower end of left radius, initial encounter for closed fracture: Secondary | ICD-10-CM | POA: Diagnosis not present

## 2020-04-14 DIAGNOSIS — W01198A Fall on same level from slipping, tripping and stumbling with subsequent striking against other object, initial encounter: Secondary | ICD-10-CM | POA: Diagnosis not present

## 2020-04-14 DIAGNOSIS — S52592A Other fractures of lower end of left radius, initial encounter for closed fracture: Secondary | ICD-10-CM | POA: Diagnosis not present

## 2020-04-14 DIAGNOSIS — S6992XA Unspecified injury of left wrist, hand and finger(s), initial encounter: Secondary | ICD-10-CM | POA: Diagnosis not present

## 2020-04-19 DIAGNOSIS — M25532 Pain in left wrist: Secondary | ICD-10-CM | POA: Diagnosis not present

## 2020-04-19 DIAGNOSIS — S52572A Other intraarticular fracture of lower end of left radius, initial encounter for closed fracture: Secondary | ICD-10-CM | POA: Diagnosis not present

## 2020-04-19 DIAGNOSIS — W01198A Fall on same level from slipping, tripping and stumbling with subsequent striking against other object, initial encounter: Secondary | ICD-10-CM | POA: Diagnosis not present

## 2020-05-03 DIAGNOSIS — M25532 Pain in left wrist: Secondary | ICD-10-CM | POA: Diagnosis not present

## 2020-05-03 DIAGNOSIS — W01198D Fall on same level from slipping, tripping and stumbling with subsequent striking against other object, subsequent encounter: Secondary | ICD-10-CM | POA: Diagnosis not present

## 2020-05-03 DIAGNOSIS — S52572D Other intraarticular fracture of lower end of left radius, subsequent encounter for closed fracture with routine healing: Secondary | ICD-10-CM | POA: Diagnosis not present

## 2020-05-23 DIAGNOSIS — Z8781 Personal history of (healed) traumatic fracture: Secondary | ICD-10-CM | POA: Diagnosis not present

## 2020-05-23 DIAGNOSIS — E782 Mixed hyperlipidemia: Secondary | ICD-10-CM | POA: Diagnosis not present

## 2020-05-23 DIAGNOSIS — I10 Essential (primary) hypertension: Secondary | ICD-10-CM | POA: Diagnosis not present

## 2020-05-23 DIAGNOSIS — Z79899 Other long term (current) drug therapy: Secondary | ICD-10-CM | POA: Diagnosis not present

## 2020-05-23 DIAGNOSIS — M170 Bilateral primary osteoarthritis of knee: Secondary | ICD-10-CM | POA: Diagnosis not present

## 2020-05-24 DIAGNOSIS — S52572D Other intraarticular fracture of lower end of left radius, subsequent encounter for closed fracture with routine healing: Secondary | ICD-10-CM | POA: Diagnosis not present

## 2020-05-24 DIAGNOSIS — M19039 Primary osteoarthritis, unspecified wrist: Secondary | ICD-10-CM | POA: Diagnosis not present

## 2020-05-24 DIAGNOSIS — M25532 Pain in left wrist: Secondary | ICD-10-CM | POA: Diagnosis not present

## 2020-05-24 DIAGNOSIS — W010XXD Fall on same level from slipping, tripping and stumbling without subsequent striking against object, subsequent encounter: Secondary | ICD-10-CM | POA: Diagnosis not present

## 2020-05-24 DIAGNOSIS — M25332 Other instability, left wrist: Secondary | ICD-10-CM | POA: Diagnosis not present

## 2020-06-23 DIAGNOSIS — M19039 Primary osteoarthritis, unspecified wrist: Secondary | ICD-10-CM | POA: Diagnosis not present

## 2020-06-23 DIAGNOSIS — M25332 Other instability, left wrist: Secondary | ICD-10-CM | POA: Diagnosis not present

## 2020-06-23 DIAGNOSIS — W19XXXD Unspecified fall, subsequent encounter: Secondary | ICD-10-CM | POA: Diagnosis not present

## 2020-06-23 DIAGNOSIS — S52572D Other intraarticular fracture of lower end of left radius, subsequent encounter for closed fracture with routine healing: Secondary | ICD-10-CM | POA: Diagnosis not present

## 2020-07-10 ENCOUNTER — Ambulatory Visit
Admission: RE | Admit: 2020-07-10 | Discharge: 2020-07-10 | Disposition: A | Discharge status: Home / Self Care | Payer: Medicare HMO | Source: Ambulatory Visit | Attending: Internal Medicine | Admitting: Internal Medicine

## 2020-07-10 DIAGNOSIS — N6310 Unspecified lump in the right breast, unspecified quadrant: Secondary | ICD-10-CM | POA: Diagnosis not present

## 2020-07-10 DIAGNOSIS — R928 Other abnormal and inconclusive findings on diagnostic imaging of breast: Secondary | ICD-10-CM | POA: Diagnosis not present

## 2020-07-10 DIAGNOSIS — N6489 Other specified disorders of breast: Secondary | ICD-10-CM | POA: Insufficient documentation

## 2020-09-11 DIAGNOSIS — H524 Presbyopia: Secondary | ICD-10-CM | POA: Diagnosis not present

## 2020-09-14 DIAGNOSIS — Z0100 Encounter for examination of eyes and vision without abnormal findings: Secondary | ICD-10-CM | POA: Diagnosis not present

## 2020-09-28 DIAGNOSIS — R8290 Unspecified abnormal findings in urine: Secondary | ICD-10-CM | POA: Diagnosis not present

## 2020-09-28 DIAGNOSIS — F411 Generalized anxiety disorder: Secondary | ICD-10-CM | POA: Diagnosis not present

## 2020-09-28 DIAGNOSIS — M170 Bilateral primary osteoarthritis of knee: Secondary | ICD-10-CM | POA: Diagnosis not present

## 2020-09-28 DIAGNOSIS — I10 Essential (primary) hypertension: Secondary | ICD-10-CM | POA: Diagnosis not present

## 2020-09-28 DIAGNOSIS — Z86010 Personal history of colonic polyps: Secondary | ICD-10-CM | POA: Diagnosis not present

## 2020-09-28 DIAGNOSIS — E782 Mixed hyperlipidemia: Secondary | ICD-10-CM | POA: Diagnosis not present

## 2020-10-05 DIAGNOSIS — E785 Hyperlipidemia, unspecified: Secondary | ICD-10-CM | POA: Diagnosis not present

## 2020-10-05 DIAGNOSIS — Z79899 Other long term (current) drug therapy: Secondary | ICD-10-CM | POA: Diagnosis not present

## 2020-10-05 DIAGNOSIS — I10 Essential (primary) hypertension: Secondary | ICD-10-CM | POA: Diagnosis not present

## 2020-10-05 DIAGNOSIS — Z86010 Personal history of colonic polyps: Secondary | ICD-10-CM | POA: Diagnosis not present

## 2020-10-05 DIAGNOSIS — M170 Bilateral primary osteoarthritis of knee: Secondary | ICD-10-CM | POA: Diagnosis not present

## 2020-10-05 DIAGNOSIS — Z8781 Personal history of (healed) traumatic fracture: Secondary | ICD-10-CM | POA: Diagnosis not present

## 2020-11-20 ENCOUNTER — Encounter: Payer: Self-pay | Admitting: Emergency Medicine

## 2020-11-20 ENCOUNTER — Emergency Department: Payer: Medicare HMO

## 2020-11-20 ENCOUNTER — Emergency Department
Admission: EM | Admit: 2020-11-20 | Discharge: 2020-11-20 | Disposition: A | Discharge status: Home / Self Care | Payer: Medicare HMO | Attending: Emergency Medicine | Admitting: Emergency Medicine

## 2020-11-20 ENCOUNTER — Other Ambulatory Visit: Payer: Self-pay

## 2020-11-20 DIAGNOSIS — R112 Nausea with vomiting, unspecified: Secondary | ICD-10-CM | POA: Diagnosis not present

## 2020-11-20 DIAGNOSIS — Z20822 Contact with and (suspected) exposure to covid-19: Secondary | ICD-10-CM | POA: Diagnosis not present

## 2020-11-20 DIAGNOSIS — J9811 Atelectasis: Secondary | ICD-10-CM | POA: Diagnosis not present

## 2020-11-20 DIAGNOSIS — T18128A Food in esophagus causing other injury, initial encounter: Secondary | ICD-10-CM | POA: Diagnosis not present

## 2020-11-20 DIAGNOSIS — K224 Dyskinesia of esophagus: Secondary | ICD-10-CM | POA: Diagnosis not present

## 2020-11-20 DIAGNOSIS — I2510 Atherosclerotic heart disease of native coronary artery without angina pectoris: Secondary | ICD-10-CM | POA: Diagnosis not present

## 2020-11-20 DIAGNOSIS — K449 Diaphragmatic hernia without obstruction or gangrene: Secondary | ICD-10-CM | POA: Diagnosis not present

## 2020-11-20 DIAGNOSIS — R1110 Vomiting, unspecified: Secondary | ICD-10-CM | POA: Diagnosis not present

## 2020-11-20 DIAGNOSIS — K222 Esophageal obstruction: Secondary | ICD-10-CM | POA: Diagnosis not present

## 2020-11-20 DIAGNOSIS — X58XXXA Exposure to other specified factors, initial encounter: Secondary | ICD-10-CM | POA: Insufficient documentation

## 2020-11-20 LAB — URINALYSIS, COMPLETE (UACMP) WITH MICROSCOPIC
Bilirubin Urine: NEGATIVE
Glucose, UA: NEGATIVE mg/dL
Hgb urine dipstick: NEGATIVE
Ketones, ur: 20 mg/dL — AB
Nitrite: NEGATIVE
Protein, ur: 30 mg/dL — AB
Specific Gravity, Urine: 1.025 (ref 1.005–1.030)
pH: 8 (ref 5.0–8.0)

## 2020-11-20 LAB — COMPREHENSIVE METABOLIC PANEL
ALT: 22 U/L (ref 0–44)
AST: 27 U/L (ref 15–41)
Albumin: 4.1 g/dL (ref 3.5–5.0)
Alkaline Phosphatase: 70 U/L (ref 38–126)
Anion gap: 9 (ref 5–15)
BUN: 17 mg/dL (ref 8–23)
CO2: 26 mmol/L (ref 22–32)
Calcium: 9.8 mg/dL (ref 8.9–10.3)
Chloride: 106 mmol/L (ref 98–111)
Creatinine, Ser: 0.87 mg/dL (ref 0.44–1.00)
GFR, Estimated: >60 mL/min (ref >60)
Glucose, Bld: 133 mg/dL — ABNORMAL HIGH (ref 70–99)
Potassium: 3.7 mmol/L (ref 3.5–5.1)
Sodium: 141 mmol/L (ref 135–145)
Total Bilirubin: 1 mg/dL (ref 0.3–1.2)
Total Protein: 8 g/dL (ref 6.5–8.1)

## 2020-11-20 LAB — RESP PANEL BY RT-PCR (FLU A&B, COVID) ARPGX2
Influenza A by PCR: NEGATIVE
Influenza B by PCR: NEGATIVE
SARS Coronavirus 2 by RT PCR: NEGATIVE

## 2020-11-20 LAB — CBC
HCT: 41.7 % (ref 36.0–46.0)
Hemoglobin: 13.7 g/dL (ref 12.0–15.0)
MCH: 28.6 pg (ref 26.0–34.0)
MCHC: 32.9 g/dL (ref 30.0–36.0)
MCV: 87.1 fL (ref 80.0–100.0)
Platelets: 424 10*3/uL — ABNORMAL HIGH (ref 150–400)
RBC: 4.79 MIL/uL (ref 3.87–5.11)
RDW: 12.8 % (ref 11.5–15.5)
WBC: 10.9 10*3/uL — ABNORMAL HIGH (ref 4.0–10.5)
nRBC: 0 % (ref 0.0–0.2)

## 2020-11-20 LAB — LIPASE, BLOOD: Lipase: 20 U/L (ref 11–51)

## 2020-11-20 MED ORDER — IOHEXOL 300 MG/ML  SOLN
75.0000 mL | Freq: Once | INTRAMUSCULAR | Status: AC | PRN
  Administered 2020-11-20: 15:00:00 75 mL via INTRAVENOUS

## 2020-11-20 MED ORDER — ONDANSETRON HCL 4 MG/2ML IJ SOLN
4.0000 mg | Freq: Once | INTRAMUSCULAR | Status: AC
  Administered 2020-11-20: 14:00:00 4 mg via INTRAVENOUS
  Filled 2020-11-20: qty 2

## 2020-11-20 MED ORDER — GLUCAGON HCL RDNA (DIAGNOSTIC) 1 MG IJ SOLR
1.0000 mg | Freq: Once | INTRAMUSCULAR | Status: AC
  Administered 2020-11-20: 16:00:00 1 mg via INTRAVENOUS
  Filled 2020-11-20: qty 1

## 2020-11-20 MED ORDER — LACTATED RINGERS IV BOLUS
1000.0000 mL | Freq: Once | INTRAVENOUS | Status: AC
  Administered 2020-11-20: 14:00:00 1000 mL via INTRAVENOUS

## 2020-11-20 MED ORDER — ONDANSETRON 4 MG PO TBDP: 4.0000 mg | ORAL_TABLET | Freq: Three times a day (TID) | ORAL | 0 refills | Status: AC | PRN

## 2020-11-20 NOTE — ED Notes (Signed)
Pt to xray

## 2020-11-20 NOTE — ED Provider Notes (Addendum)
[facility] Emergency Department Provider Note   ____________________________________________   First MD Initiated Contact with Patient 11/20/20 1319     (approximate)  I have reviewed the triage vital signs and the nursing notes.   HISTORY  Chief Complaint Emesis    HPI Jeanne Bennett is a 74 y.o. female with a stated past medical history of a Schatzki's ring who presents for p.o. intolerance and vomiting after eating stuffing yesterday.  Patient states that since approximately afternoon yesterday she has been unable to keep down any solid or liquid p.o. intake and has the sensation of something stuck in her chest.  Patient states that she has had similar symptoms in the past when she has had esophageal impactions and has needed upper endoscopy.  Patient states that she tried a carbonated beverage in order to release this impaction but did not have any success.  Patient does endorse some mild midepigastric abdominal pain that began after she started vomiting has remained stable since onset with worsening symptoms whenever she is vomiting.         Past Medical History:  Diagnosis Date  . Arthritis   . Hx of adenomatous colonic polyps   . Hyperlipidemia   . Schatzki's ring     There are no problems to display for this patient.   Past Surgical History:  Procedure Laterality Date  . CHOLECYSTECTOMY    . COLONOSCOPY    . COLONOSCOPY WITH PROPOFOL N/A 09/18/2015   Procedure: COLONOSCOPY WITH PROPOFOL;  Surgeon: Manya Silvas, MD;  Location: [facility] ENDOSCOPY;  Service: Endoscopy;  Laterality: N/A;  . ESOPHAGOGASTRODUODENOSCOPY      Prior to Admission medications   Medication Sig Start Date End Date Taking? Authorizing Provider  amLODipine (NORVASC) 2.5 MG tablet Take 2.5 mg by mouth daily.    [provider]  calcium carbonate (OSCAL) 1500 (600 CA) MG TABS tablet Take by mouth 2 (two) times daily with a meal.    [provider]   Flaxseed, Linseed, (FLAXSEED OIL) 1000 MG CAPS Take 1,000 mg by mouth.    [provider]  Garlic 419 MG TABS Take 1,000 mg by mouth.    [provider]  lovastatin (MEVACOR) 40 MG tablet Take 40 mg by mouth at bedtime.    [provider]  ondansetron (ZOFRAN ODT) 4 MG disintegrating tablet Take 1 tablet (4 mg total) by mouth every 8 (eight) hours as needed for nausea or vomiting. 11/20/20   Naaman Plummer, MD  vitamin E 400 UNIT capsule Take 400 Units by mouth daily.    [provider]    Allergies Cyclobenzaprine  Family History  Problem Relation Age of Onset  . Breast cancer Neg Hx     Social History Social History   Tobacco Use  . Smoking status: Never Smoker  . Smokeless tobacco: Never Used  Substance Use Topics  . Alcohol use: No  . Drug use: No    Review of Systems Constitutional: No fever/chills Eyes: No visual changes. ENT: No sore throat. Cardiovascular: Denies chest pain. Respiratory: Denies shortness of breath. Gastrointestinal: Endorses abdominal pain.  Endorses nausea, no vomiting.  No diarrhea. Genitourinary: Negative for dysuria. Musculoskeletal: Negative for acute arthralgias Skin: Negative for rash. Neurological: Negative for headaches, weakness/numbness/paresthesias in any extremity Psychiatric: Negative for suicidal ideation/homicidal ideation   ____________________________________________   PHYSICAL EXAM:  VITAL SIGNS: ED Triage Vitals  Enc Vitals Group     BP 11/20/20 1146 (!) 154/67  Pulse Rate 11/20/20 1146 88     Resp 11/20/20 1146 20     Temp 11/20/20 1146 98.9 F (37.2 C)     Temp Source 11/20/20 1146 Oral     SpO2 11/20/20 1146 99 %     Weight 11/20/20 1147 165 lb (74.8 kg)     Height 11/20/20 1147 5' (1.524 m)     Head Circumference --      Peak Flow --      Pain Score 11/20/20 1147 0     Pain Loc --      Pain Edu? --      Excl. in [place]? --    Constitutional: Alert and oriented. Well  appearing and in no acute distress. Eyes: Conjunctivae are normal. PERRL. Head: Atraumatic. Nose: No congestion/rhinnorhea. Mouth/Throat: Mucous membranes are moist. Neck: No stridor Cardiovascular: Grossly normal heart sounds.  Good peripheral circulation. Respiratory: Normal respiratory effort.  No retractions. Gastrointestinal: Soft and nontender. No distention. Musculoskeletal: No obvious deformities Neurologic:  Normal speech and language. No gross focal neurologic deficits are appreciated. Skin:  Skin is warm and dry. No rash noted. Psychiatric: Mood and affect are normal. Speech and behavior are normal.  ____________________________________________   LABS (all labs ordered are listed, but only abnormal results are displayed)  Labs Reviewed  COMPREHENSIVE METABOLIC PANEL - Abnormal; Notable for the following components:      Result Value   Glucose, Bld 133 (*)    All other components within normal limits  CBC - Abnormal; Notable for the following components:   WBC 10.9 (*)    Platelets 424 (*)    All other components within normal limits  URINALYSIS, COMPLETE (UACMP) WITH MICROSCOPIC - Abnormal; Notable for the following components:   Color, Urine AMBER (*)    APPearance CLOUDY (*)    Ketones, ur 20 (*)    Protein, ur 30 (*)    Leukocytes,Ua SMALL (*)    Bacteria, UA FEW (*)    All other components within normal limits  RESP PANEL BY RT-PCR (FLU A&B, COVID) ARPGX2  LIPASE, BLOOD   ____________________________________________  EKG  ED ECG REPORT I, Naaman Plummer, the attending physician, personally viewed and interpreted this ECG.  Date: 11/20/2020 EKG Time: 1150 Rate: 94 Rhythm: normal sinus rhythm QRS Axis: normal Intervals: normal ST/T Wave abnormalities: normal Narrative Interpretation: no evidence of acute ischemia  ____________________________________________  RADIOLOGY  ED MD interpretation: CT of the chest with contrast shows no evidence of  acute esophageal impaction  Water-soluble swallow study shows no evidence of esophageal impaction  Official radiology report(s): CT Chest W Contrast  Result Date: 11/20/2020 CLINICAL DATA:  Questionable food impaction.  Vomiting EXAM: CT CHEST WITH CONTRAST TECHNIQUE: Multidetector CT imaging of the chest was performed during intravenous contrast administration. CONTRAST:  28mL OMNIPAQUE IOHEXOL 300 MG/ML  SOLN COMPARISON:  None. FINDINGS: Cardiovascular: There is no thoracic aortic aneurysm or dissection. Visualized great vessels appear unremarkable. There are foci of aortic atherosclerosis. There are foci of coronary artery calcification. There is no pericardial effusion or pericardial thickening. No major vessel pulmonary embolus. Mediastinum/Nodes: Thyroid appears unremarkable. There are occasional subcentimeter mediastinal lymph nodes. No adenopathy evident by size criteria. There is a focal hiatal hernia. There is no pneumomediastinum. There is no appreciable esophageal wall thickening. Scattered foci of air noted in the esophagus. There are no findings which appear indicative of radiopaque foreign body within the esophagus. Lungs/Pleura: There is slight atelectatic change in the left upper lobe. No  edema or airspace opacity. No pneumothorax. Trachea and major bronchial structures appear patent. No pleural effusions are evident. Upper Abdomen: There is hepatic steatosis. Gallbladder is absent. Proximal common bile duct is dilated to 12 mm. No obstructing lesions seen in visualized portions of biliary ductal system. There is a cyst arising from the posterior upper right kidney measuring 4.7 x 4.6 cm. There is a cyst in the upper pole of the left kidney measuring 0.9 x 0.7 cm. There is upper abdominal aortic atherosclerosis. Musculoskeletal: There are foci of degenerative change in the thoracic spine. No blastic or lytic bone lesions are evident. No chest wall lesions. IMPRESSION: 1. Focal fairly small  hiatal hernia. The esophageal wall does not appear appreciably thickened, and no radiopaque foreign body is evident. Scattered foci of air seen in the esophagus. No pneumomediastinum or periesophageal fluid. If there remains concern for potential esophageal impaction, repeat study after administration of oral contrast could be helpful for further assessment. 2. Slight left upper lobe atelectasis. Lungs otherwise clear. No pleural effusions. 3.  No evident adenopathy. 4. Aortic atherosclerosis. Foci of coronary artery calcification noted. 5.  Hepatic steatosis. 6. Gallbladder absent. Prominence of the proximal common bile duct noted. Note that the entire common bile duct is not appreciable on this chest CT examination. It may be reasonable to correlate with ultrasound right upper quadrant to visualize the more distal common bile duct in this circumstance. Aortic Atherosclerosis (ICD10-I70.0). Electronically Signed   By: Lowella Grip III M.D.   On: 11/20/2020 14:56   DG ESOPHAGUS W SINGLE CM (SOL OR THIN BA)  Result Date: 11/20/2020 CLINICAL DATA:  Possible food impaction/esophageal obstruction. EXAM: ESOPHOGRAM/BARIUM SWALLOW TECHNIQUE: Single contrast examination was performed using water-soluble contrast followed by thin barium. FLUOROSCOPY TIME:  Fluoroscopy Time:  1 minutes 3 seconds Radiation Exposure Index (if provided by the fluoroscopic device): 34.9 mGy Number of Acquired Spot Images: 6 COMPARISON:  Chest CT today. FINDINGS: A limited barium esophagram was performed using water-soluble and thin barium with patient in the standing upright position. Esophagus is normal course, contour and caliber without focal mass, stricture or filling defect. No evidence mucosal injury or contrast extravasation. Mild dysmotility in the form of multiple tertiary contractions. No evidence of aspiration. Small hiatal hernia. IMPRESSION: 1.  No evidence of esophageal stricture or obstruction. 2. 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 Small hiatal hernia. Electronically Signed   By: Marin Olp M.D.   On: 11/20/2020 16:33    ____________________________________________   PROCEDURES  Procedure(s) performed (including Critical Care):  .1-3 Lead EKG Interpretation Performed by: Naaman Plummer, MD Authorized by: Naaman Plummer, MD     Interpretation: normal     ECG rate:  78   ECG rate assessment: normal     Rhythm: sinus rhythm     Ectopy: none     Conduction: normal       ____________________________________________   INITIAL IMPRESSION / ASSESSMENT AND PLAN / ED COURSE  As part of my medical decision making, I reviewed the following data within the [place] notes reviewed and incorporated, Labs reviewed, EKG interpreted, Old chart reviewed, Radiograph reviewed and Notes from prior ED visits reviewed and incorporated        Patient presents for nausea/vomiting in the setting of a Schatzki's ring and the sensation of food impaction.  Differential diagnosis includes esophageal impaction, gastroenteritis, small bowel obstruction, appendicitis, cholecystitis Imaging: CT of the chest as well as a swallow  study does not show any evidence of esophageal foreign body.   Patient is p.o. tolerant without difficulty and states that she feels much better Patient agrees with plan for discharge home and follow-up with her primary care provider or return if symptoms recur.      ____________________________________________   FINAL CLINICAL IMPRESSION(S) / ED DIAGNOSES  Final diagnoses:  Esophageal obstruction due to food impaction  Non-intractable vomiting with nausea, unspecified vomiting type     ED Discharge Orders         Ordered    ondansetron (ZOFRAN ODT) 4 MG disintegrating tablet  Every 8 hours PRN        11/20/20 1857           Note:  This document was prepared using Dragon voice recognition software and may  include unintentional dictation errors.   Naaman Plummer, MD 11/20/20 2008    Naaman Plummer, MD 11/20/20 2008

## 2020-11-20 NOTE — ED Notes (Signed)
Pt reports she has previously had food stuck in her throat, reports two prior upper endoscopies

## 2020-11-20 NOTE — ED Notes (Signed)
Pt changed into gown.

## 2020-11-20 NOTE — ED Triage Notes (Signed)
Patient to ER for c/o vomiting after eating stuffing (started yesterday). Patient states she swallowed stuffing without chewing, and then immediately vomited afterwards. Patient states she has been vomiting since, approx 10+ times.

## 2020-11-20 NOTE — ED Notes (Signed)
Pt reports that yesterday at approx 1500 she had some leftover stuffing and gravy, which she states she swallowed but did not really chew. Pt states that immediately after eating, she threw up. Pt reports that every time she would eat or drink, she would immediately throw it back up. Pt thinks she has thrown up about 10 times since then, only when trying to eat or drink.   Pt has not been able to keep anything down since then. Pt denies constant feeling nauseous. Pt states she took some pepto-bismol, tried to drink some Dr. Malachi Bonds, did not experience any significant relief  Pt denies abd pain, fevers

## 2020-11-24 DIAGNOSIS — Z09 Encounter for follow-up examination after completed treatment for conditions other than malignant neoplasm: Secondary | ICD-10-CM | POA: Diagnosis not present

## 2020-11-24 DIAGNOSIS — I10 Essential (primary) hypertension: Secondary | ICD-10-CM | POA: Diagnosis not present

## 2020-11-24 DIAGNOSIS — M170 Bilateral primary osteoarthritis of knee: Secondary | ICD-10-CM | POA: Diagnosis not present

## 2020-11-24 DIAGNOSIS — E785 Hyperlipidemia, unspecified: Secondary | ICD-10-CM | POA: Diagnosis not present

## 2020-11-24 DIAGNOSIS — Z79899 Other long term (current) drug therapy: Secondary | ICD-10-CM | POA: Diagnosis not present

## 2021-03-16 DIAGNOSIS — M19011 Primary osteoarthritis, right shoulder: Secondary | ICD-10-CM | POA: Diagnosis not present

## 2021-03-16 DIAGNOSIS — S4991XA Unspecified injury of right shoulder and upper arm, initial encounter: Secondary | ICD-10-CM | POA: Diagnosis not present

## 2021-03-16 DIAGNOSIS — S6991XA Unspecified injury of right wrist, hand and finger(s), initial encounter: Secondary | ICD-10-CM | POA: Diagnosis not present

## 2021-03-16 DIAGNOSIS — S6992XA Unspecified injury of left wrist, hand and finger(s), initial encounter: Secondary | ICD-10-CM | POA: Diagnosis not present

## 2021-03-16 DIAGNOSIS — W010XXA Fall on same level from slipping, tripping and stumbling without subsequent striking against object, initial encounter: Secondary | ICD-10-CM | POA: Diagnosis not present

## 2021-03-21 DIAGNOSIS — E785 Hyperlipidemia, unspecified: Secondary | ICD-10-CM | POA: Diagnosis not present

## 2021-03-21 DIAGNOSIS — M170 Bilateral primary osteoarthritis of knee: Secondary | ICD-10-CM | POA: Diagnosis not present

## 2021-03-21 DIAGNOSIS — M25511 Pain in right shoulder: Secondary | ICD-10-CM | POA: Diagnosis not present

## 2021-03-21 DIAGNOSIS — M47812 Spondylosis without myelopathy or radiculopathy, cervical region: Secondary | ICD-10-CM | POA: Diagnosis not present

## 2021-03-21 DIAGNOSIS — I10 Essential (primary) hypertension: Secondary | ICD-10-CM | POA: Diagnosis not present

## 2021-03-21 DIAGNOSIS — R609 Edema, unspecified: Secondary | ICD-10-CM | POA: Diagnosis not present

## 2021-03-21 DIAGNOSIS — F411 Generalized anxiety disorder: Secondary | ICD-10-CM | POA: Diagnosis not present

## 2021-03-21 DIAGNOSIS — Z9181 History of falling: Secondary | ICD-10-CM | POA: Diagnosis not present

## 2021-03-23 ENCOUNTER — Other Ambulatory Visit: Payer: Self-pay | Admitting: Internal Medicine

## 2021-03-23 DIAGNOSIS — N6489 Other specified disorders of breast: Secondary | ICD-10-CM

## 2021-05-25 DIAGNOSIS — M1711 Unilateral primary osteoarthritis, right knee: Secondary | ICD-10-CM | POA: Diagnosis not present

## 2021-06-05 ENCOUNTER — Other Ambulatory Visit: Payer: Self-pay

## 2021-06-05 ENCOUNTER — Emergency Department
Admission: EM | Admit: 2021-06-05 | Discharge: 2021-06-05 | Disposition: A | Discharge status: Home / Self Care | Payer: Medicare HMO | Attending: Emergency Medicine | Admitting: Emergency Medicine

## 2021-06-05 ENCOUNTER — Emergency Department: Payer: Medicare HMO

## 2021-06-05 DIAGNOSIS — S93491A Sprain of other ligament of right ankle, initial encounter: Secondary | ICD-10-CM | POA: Diagnosis not present

## 2021-06-05 DIAGNOSIS — S93431A Sprain of tibiofibular ligament of right ankle, initial encounter: Secondary | ICD-10-CM | POA: Diagnosis not present

## 2021-06-05 DIAGNOSIS — S92151A Displaced avulsion fracture (chip fracture) of right talus, initial encounter for closed fracture: Secondary | ICD-10-CM | POA: Diagnosis not present

## 2021-06-05 DIAGNOSIS — M1711 Unilateral primary osteoarthritis, right knee: Secondary | ICD-10-CM | POA: Diagnosis not present

## 2021-06-05 DIAGNOSIS — S99911A Unspecified injury of right ankle, initial encounter: Secondary | ICD-10-CM | POA: Diagnosis present

## 2021-06-05 DIAGNOSIS — I10 Essential (primary) hypertension: Secondary | ICD-10-CM | POA: Diagnosis not present

## 2021-06-05 DIAGNOSIS — S0990XA Unspecified injury of head, initial encounter: Secondary | ICD-10-CM | POA: Diagnosis not present

## 2021-06-05 DIAGNOSIS — W19XXXA Unspecified fall, initial encounter: Secondary | ICD-10-CM

## 2021-06-05 DIAGNOSIS — W010XXA Fall on same level from slipping, tripping and stumbling without subsequent striking against object, initial encounter: Secondary | ICD-10-CM | POA: Diagnosis not present

## 2021-06-05 DIAGNOSIS — R52 Pain, unspecified: Secondary | ICD-10-CM | POA: Diagnosis not present

## 2021-06-05 MED ORDER — TRAMADOL HCL 50 MG PO TABS: 50.0000 mg | ORAL_TABLET | Freq: Four times a day (QID) | ORAL | 0 refills | Status: AC | PRN

## 2021-06-05 MED ORDER — NAPROXEN SODIUM 220 MG PO TABS: 220.0000 mg | ORAL_TABLET | Freq: Two times a day (BID) | ORAL | 0 refills | Status: AC | PRN

## 2021-06-05 NOTE — ED Triage Notes (Addendum)
Pt comes into the ED via EMS from place of business with c/o right knee giving out and falling. C/o  having right knee pain , right ankle ain and states she hit the back of her head, denies LOC, no blood thinners. Pt is a/ox4 on arrival , using a cane for ambulation  192/84 97%RA 89HR

## 2021-06-05 NOTE — ED Notes (Signed)
See triage note  Presents s/p fall  States she was getting in car and her right knee gave  [address] backwards  States she hit her head  No LOC   Min swelling noted to right knee and ankle

## 2021-06-05 NOTE — ED Provider Notes (Signed)
[facility] Emergency Department Provider Note   ____________________________________________    I have reviewed the triage vital signs and the nursing notes.   HISTORY  Chief Complaint Fall     HPI Jeanne Bennett is a 75 y.o. female who presents after a fall.  Patient reports she lost her balance and fell onto her right knee and twisted her right ankle.  This occurred just prior to arrival.  She denies other injuries.  She was getting into a car when this occurred.  No head injury, not on blood thinners  Past Medical History:  Diagnosis Date   Arthritis    Hx of adenomatous colonic polyps    Hyperlipidemia    Schatzki's ring     There are no problems to display for this patient.   Past Surgical History:  Procedure Laterality Date   CHOLECYSTECTOMY     COLONOSCOPY     COLONOSCOPY WITH PROPOFOL N/A 09/18/2015   Procedure: COLONOSCOPY WITH PROPOFOL;  Surgeon: Manya Silvas, MD;  Location: [facility] ENDOSCOPY;  Service: Endoscopy;  Laterality: N/A;   ESOPHAGOGASTRODUODENOSCOPY      Prior to Admission medications   Medication Sig Start Date End Date Taking? Authorizing Provider  naproxen sodium (ALEVE) 220 MG tablet Take 1 tablet (220 mg total) by mouth 2 (two) times daily as needed (ankle pain). 06/05/21  Yes Lavonia Drafts, MD  traMADol (ULTRAM) 50 MG tablet Take 1 tablet (50 mg total) by mouth every 6 (six) hours as needed for severe pain or moderate pain. 06/05/21 06/05/22 Yes Lavonia Drafts, MD  amLODipine (NORVASC) 2.5 MG tablet Take 2.5 mg by mouth daily.    [provider]  calcium carbonate (OSCAL) 1500 (600 CA) MG TABS tablet Take by mouth 2 (two) times daily with a meal.    [provider]  Flaxseed, Linseed, (FLAXSEED OIL) 1000 MG CAPS Take 1,000 mg by mouth.    [provider]  Garlic 314 MG TABS Take 1,000 mg by mouth.    [provider]  lovastatin (MEVACOR) 40 MG tablet Take 40 mg by mouth at  bedtime.    [provider]  ondansetron (ZOFRAN ODT) 4 MG disintegrating tablet Take 1 tablet (4 mg total) by mouth every 8 (eight) hours as needed for nausea or vomiting. 11/20/20   Naaman Plummer, MD  vitamin E 400 UNIT capsule Take 400 Units by mouth daily.    [provider]     Allergies Cyclobenzaprine  Family History  Problem Relation Age of Onset   Breast cancer Neg Hx     Social History Social History   Tobacco Use   Smoking status: Never   Smokeless tobacco: Never  Substance Use Topics   Alcohol use: No   Drug use: No    Review of Systems  Constitutional: No dizziness  ENT: No facial   Gastrointestinal: No abdominal pain.  No nausea, no vomiting.    Musculoskeletal: As above Skin: Negative for rash. Neurological: Negative for headaches     ____________________________________________   PHYSICAL EXAM:  VITAL SIGNS: ED Triage Vitals  Enc Vitals Group     BP 06/05/21 0834 (!) 166/67     Pulse Rate 06/05/21 0834 72     Resp 06/05/21 0834 16     Temp 06/05/21 0834 97.9 F (36.6 C)     Temp Source 06/05/21 0834 Oral     SpO2 06/05/21 0834 96 %     Weight 06/05/21 0828 73.5  kg (162 lb)     Height 06/05/21 0828 1.524 m (5')     Head Circumference --      Peak Flow --      Pain Score 06/05/21 0828 8     Pain Loc --      Pain Edu? --      Excl. in [place]? --      Constitutional: Alert and oriented. No acute distress. Pleasant and interactive  Head: Atraumatic.  Mouth/Throat: Mucous membranes are moist.   Cardiovascular: Normal rate, regular rhythm.  Respiratory: Normal respiratory effort.  No retractions. Genitourinary: deferred Musculoskeletal: Minor swelling of the right knee, no bony normalities palpated, no abrasion or laceration.  Mild tenderness along the anterior talofibular ligament on the right ankle, mild swelling noted. Neurologic:  Normal speech and language. No gross focal neurologic deficits are appreciated.    Skin:  Skin is warm, dry and intact. No rash noted.   ____________________________________________   LABS (all labs ordered are listed, but only abnormal results are displayed)  Labs Reviewed - No data to display ____________________________________________  EKG   ____________________________________________  RADIOLOGY  Knee and ankle x-rays reviewed by me ____________________________________________   PROCEDURES  Procedure(s) performed: No  Procedures   Critical Care performed: No ____________________________________________   INITIAL IMPRESSION / ASSESSMENT AND PLAN / ED COURSE  Pertinent labs & imaging results that were available during my care of the patient were reviewed by me and considered in my medical decision making (see chart for details).   Patient presents after a fall as above, overall reassuring exam.  X-ray demonstrates no acute fracture of the right knee although consistent with arthritis.  Avulsion fracture of the right ankle.  Patient placed in boot, analgesics provided, recommend rice, outpatient follow-up with Ortho.   ____________________________________________   FINAL CLINICAL IMPRESSION(S) / ED DIAGNOSES  Final diagnoses:  Fall, initial encounter  Sprain of anterior talofibular ligament of right ankle, initial encounter      NEW MEDICATIONS STARTED DURING THIS VISIT:  Discharge Medication List as of 06/05/2021 10:43 AM     START taking these medications   Details  naproxen sodium (ALEVE) 220 MG tablet Take 1 tablet (220 mg total) by mouth 2 (two) times daily as needed (ankle pain)., Starting Tue 06/05/2021, Normal    traMADol (ULTRAM) 50 MG tablet Take 1 tablet (50 mg total) by mouth every 6 (six) hours as needed for severe pain or moderate pain., Starting Tue 06/05/2021, Until Wed 06/05/2022 at 2359, Normal         Note:  This document was prepared using Dragon voice recognition software and may include unintentional dictation  errors.    Lavonia Drafts, MD 06/05/21 1233

## 2021-06-07 ENCOUNTER — Other Ambulatory Visit: Payer: Self-pay | Admitting: Internal Medicine

## 2021-06-07 DIAGNOSIS — N6489 Other specified disorders of breast: Secondary | ICD-10-CM

## 2021-06-12 DIAGNOSIS — E785 Hyperlipidemia, unspecified: Secondary | ICD-10-CM | POA: Diagnosis not present

## 2021-06-12 DIAGNOSIS — M170 Bilateral primary osteoarthritis of knee: Secondary | ICD-10-CM | POA: Diagnosis not present

## 2021-06-12 DIAGNOSIS — I10 Essential (primary) hypertension: Secondary | ICD-10-CM | POA: Diagnosis not present

## 2021-06-12 DIAGNOSIS — M25561 Pain in right knee: Secondary | ICD-10-CM | POA: Diagnosis not present

## 2021-06-12 DIAGNOSIS — Z9181 History of falling: Secondary | ICD-10-CM | POA: Diagnosis not present

## 2021-06-12 DIAGNOSIS — M25571 Pain in right ankle and joints of right foot: Secondary | ICD-10-CM | POA: Diagnosis not present

## 2021-06-14 DIAGNOSIS — M1711 Unilateral primary osteoarthritis, right knee: Secondary | ICD-10-CM | POA: Diagnosis not present

## 2021-06-14 DIAGNOSIS — S93401A Sprain of unspecified ligament of right ankle, initial encounter: Secondary | ICD-10-CM | POA: Diagnosis not present

## 2021-06-14 DIAGNOSIS — M25561 Pain in right knee: Secondary | ICD-10-CM | POA: Diagnosis not present

## 2021-06-14 DIAGNOSIS — M25571 Pain in right ankle and joints of right foot: Secondary | ICD-10-CM | POA: Diagnosis not present

## 2021-06-14 DIAGNOSIS — W010XXA Fall on same level from slipping, tripping and stumbling without subsequent striking against object, initial encounter: Secondary | ICD-10-CM | POA: Diagnosis not present

## 2021-06-18 DIAGNOSIS — R6889 Other general symptoms and signs: Secondary | ICD-10-CM | POA: Diagnosis not present

## 2021-06-22 DIAGNOSIS — R6889 Other general symptoms and signs: Secondary | ICD-10-CM | POA: Diagnosis not present

## 2021-06-22 DIAGNOSIS — R42 Dizziness and giddiness: Secondary | ICD-10-CM | POA: Diagnosis not present

## 2021-06-22 DIAGNOSIS — M1711 Unilateral primary osteoarthritis, right knee: Secondary | ICD-10-CM | POA: Diagnosis not present

## 2021-07-05 DIAGNOSIS — M1711 Unilateral primary osteoarthritis, right knee: Secondary | ICD-10-CM | POA: Diagnosis not present

## 2021-07-05 DIAGNOSIS — E785 Hyperlipidemia, unspecified: Secondary | ICD-10-CM | POA: Diagnosis not present

## 2021-07-05 DIAGNOSIS — Z9181 History of falling: Secondary | ICD-10-CM | POA: Diagnosis not present

## 2021-07-05 DIAGNOSIS — Z0000 Encounter for general adult medical examination without abnormal findings: Secondary | ICD-10-CM | POA: Diagnosis not present

## 2021-07-05 DIAGNOSIS — I10 Essential (primary) hypertension: Secondary | ICD-10-CM | POA: Diagnosis not present

## 2021-07-11 ENCOUNTER — Ambulatory Visit
Admission: RE | Admit: 2021-07-11 | Discharge: 2021-07-11 | Disposition: A | Discharge status: Home / Self Care | Payer: Medicare HMO | Source: Ambulatory Visit | Attending: Internal Medicine | Admitting: Internal Medicine

## 2021-07-11 ENCOUNTER — Other Ambulatory Visit: Payer: Self-pay

## 2021-07-11 DIAGNOSIS — N6489 Other specified disorders of breast: Secondary | ICD-10-CM | POA: Insufficient documentation

## 2021-07-11 DIAGNOSIS — R922 Inconclusive mammogram: Secondary | ICD-10-CM | POA: Diagnosis not present

## 2021-07-24 DIAGNOSIS — M1711 Unilateral primary osteoarthritis, right knee: Secondary | ICD-10-CM | POA: Diagnosis not present

## 2021-07-24 DIAGNOSIS — Z9181 History of falling: Secondary | ICD-10-CM | POA: Diagnosis not present

## 2021-09-06 DIAGNOSIS — M1711 Unilateral primary osteoarthritis, right knee: Secondary | ICD-10-CM | POA: Diagnosis not present

## 2021-10-11 IMAGING — MG DIGITAL DIAGNOSTIC BILAT W/ TOMO W/ CAD
8 series · 8 of 24 positions shown · non-contrast
Comparison: Previous exam(s).

CLINICAL DATA: 75-year-old female for 2 year follow-up of RIGHT
breast asymmetry and for annual bilateral mammogram.

EXAM:
DIGITAL DIAGNOSTIC BILATERAL MAMMOGRAM WITH CAD AND TOMO

[L CC synth-2D]
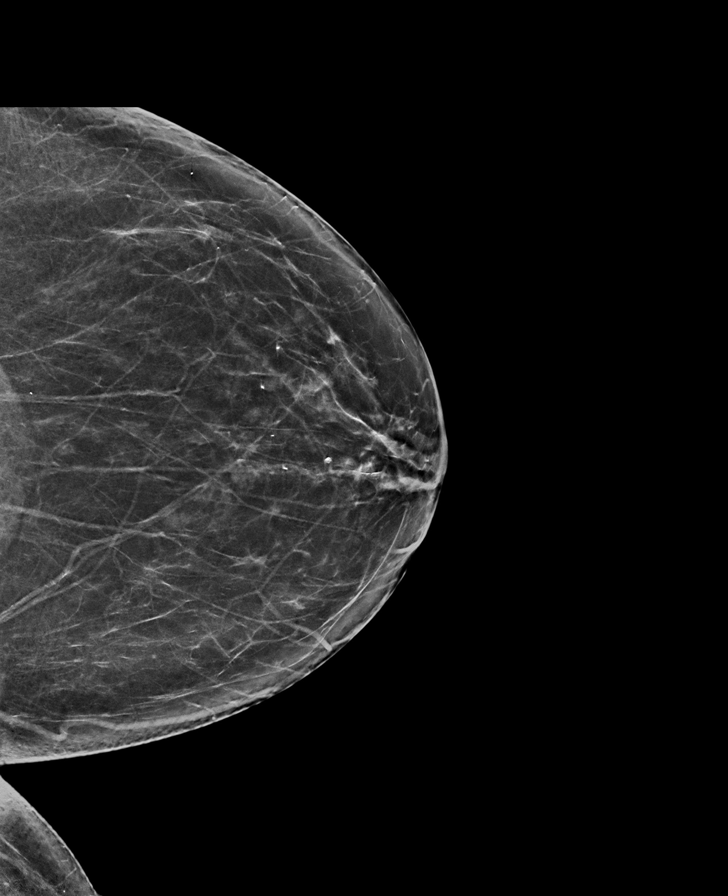

[L MLO synth-2D]
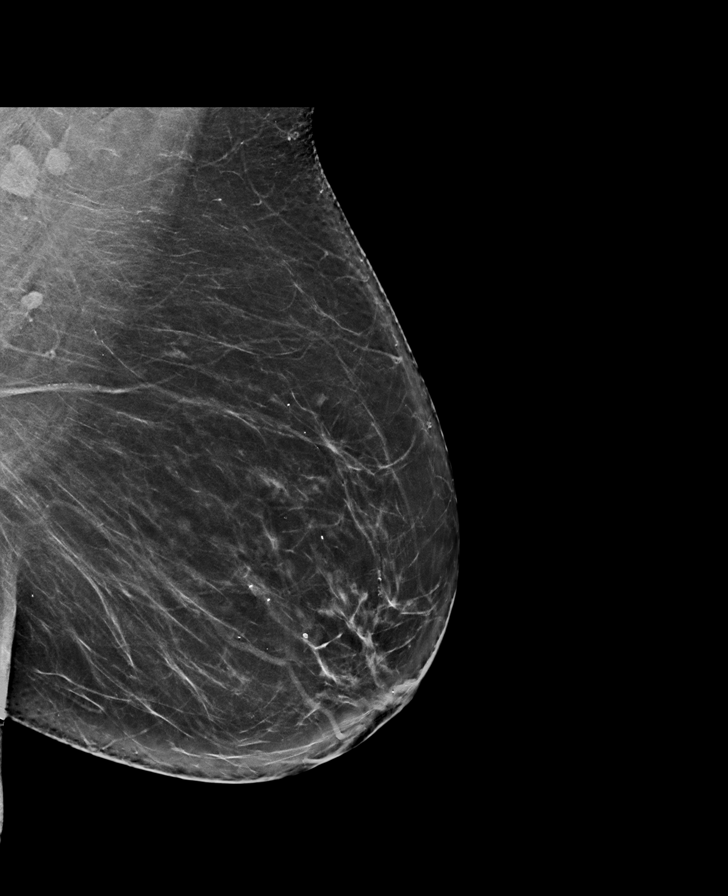

[R CC synth-2D]
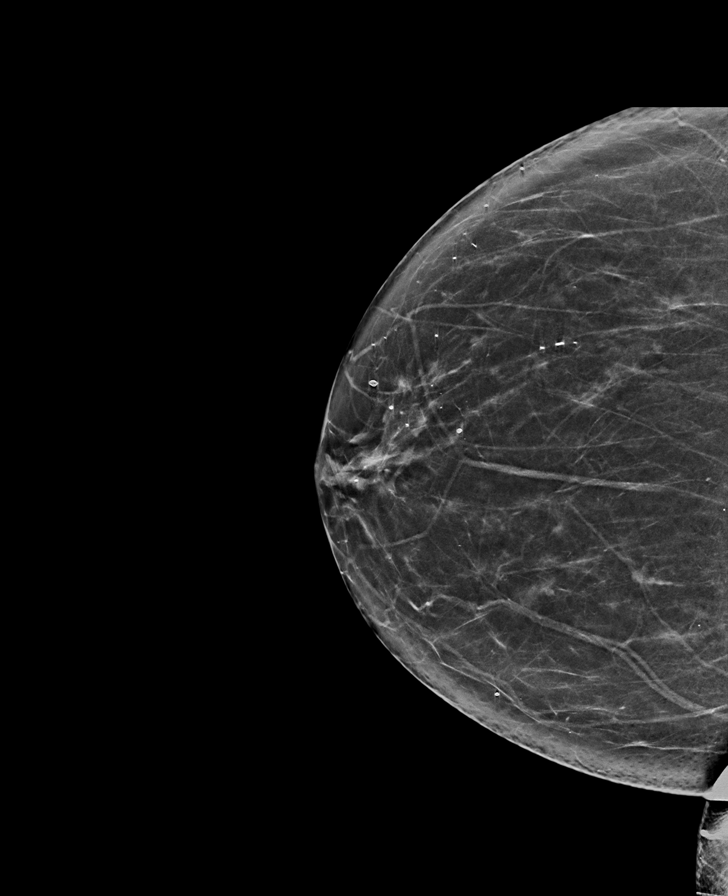

[R MLO synth-2D]
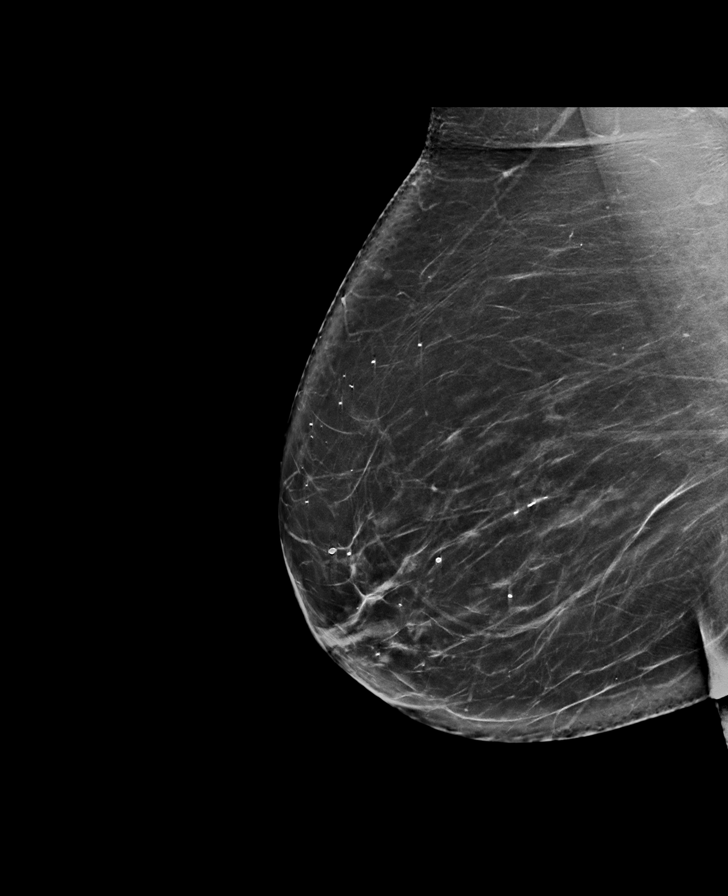

[R MLO tomo · tomo slice 43/85.0]
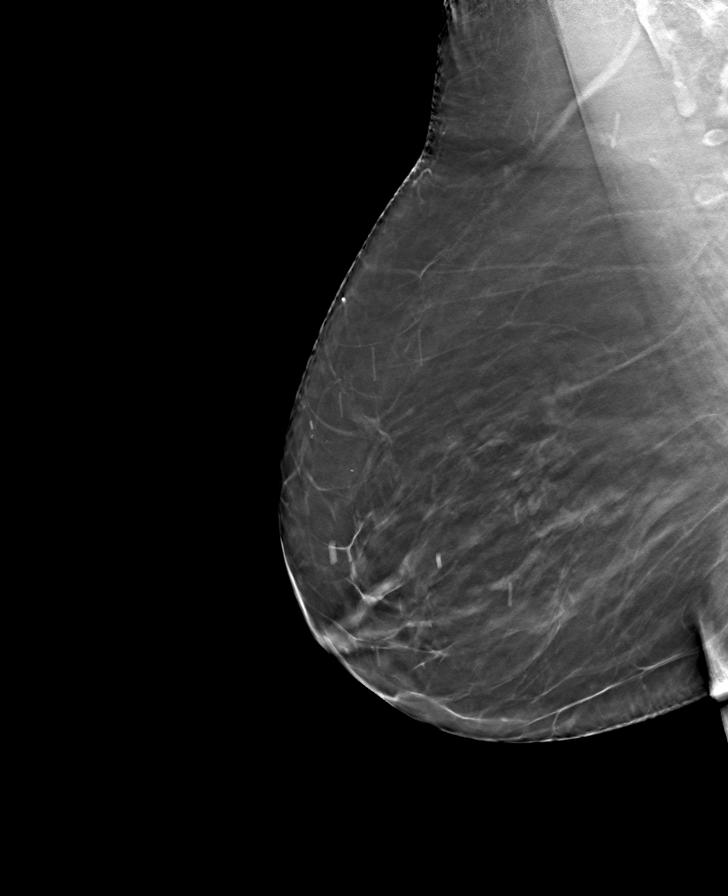

[L CC tomo · tomo slice 37/73.0]
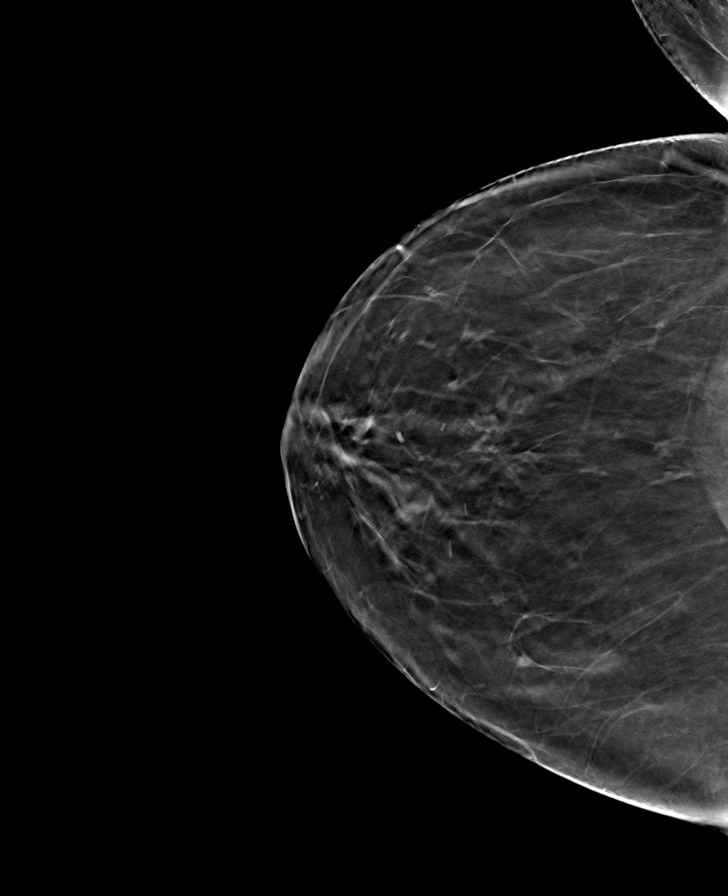

[L MLO tomo · tomo slice 41/82.0]
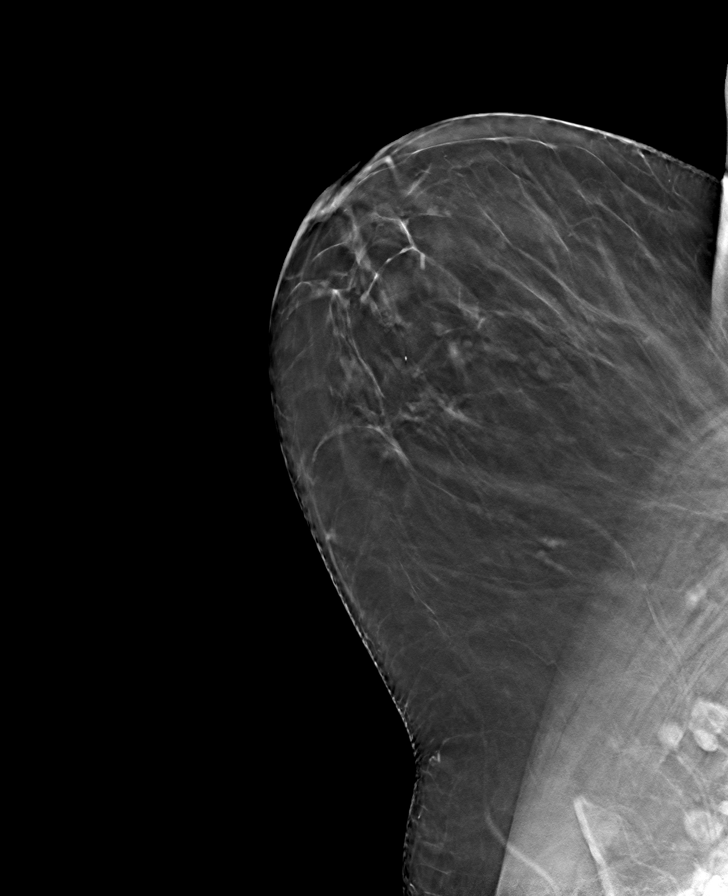

[R CC tomo · tomo slice 34/67.0]
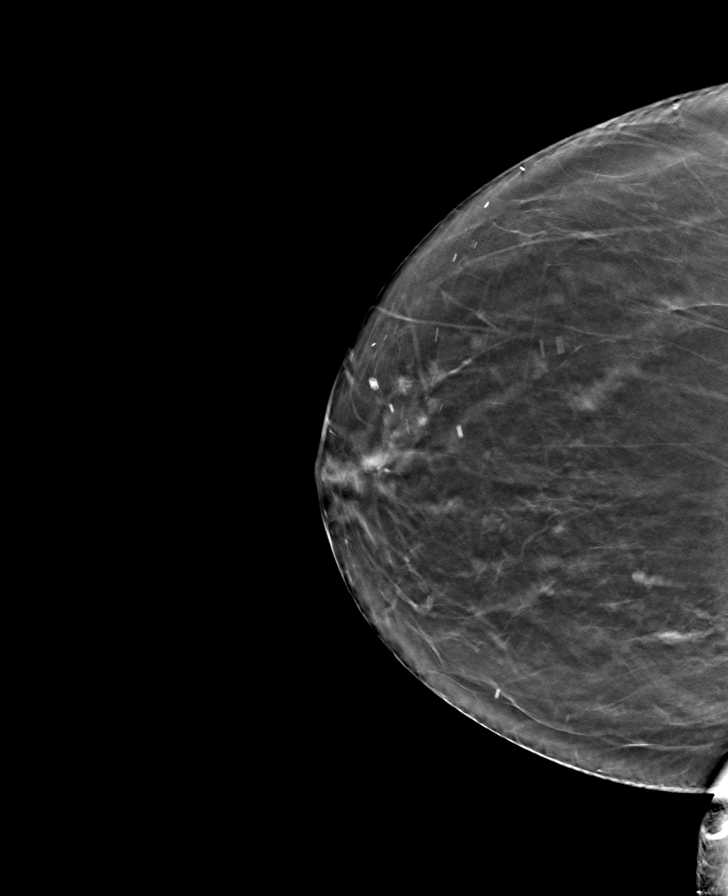

[8 of 24 positions shown; findings below may reference images not displayed]

ACR Breast Density Category b: There are scattered areas of
fibroglandular density.
FINDINGS: 2D/3D full field views of both breasts demonstrate slightly less
conspicuous asymmetry within the INNER RIGHT breast.

No new or suspicious mammographic findings are noted.

Mammographic images were processed with CAD.
IMPRESSION: 1. Slightly less conspicuous INNER RIGHT breast asymmetry since
study 2 years ago and compatible with a benign process.
2. No evidence of breast malignancy.

RECOMMENDATION:
Bilateral screening mammogram in 1 year.

I have discussed the findings and recommendations with the patient.
If applicable, a reminder letter will be sent to the patient
regarding the next appointment.

BI-RADS CATEGORY  2: Benign.

## 2021-10-15 DIAGNOSIS — R6889 Other general symptoms and signs: Secondary | ICD-10-CM | POA: Diagnosis not present

## 2021-10-15 DIAGNOSIS — Z0000 Encounter for general adult medical examination without abnormal findings: Secondary | ICD-10-CM | POA: Diagnosis not present

## 2021-10-15 DIAGNOSIS — E782 Mixed hyperlipidemia: Secondary | ICD-10-CM | POA: Diagnosis not present

## 2021-10-19 DIAGNOSIS — R2689 Other abnormalities of gait and mobility: Secondary | ICD-10-CM | POA: Diagnosis not present

## 2021-10-19 DIAGNOSIS — Z79899 Other long term (current) drug therapy: Secondary | ICD-10-CM | POA: Diagnosis not present

## 2021-10-19 DIAGNOSIS — E782 Mixed hyperlipidemia: Secondary | ICD-10-CM | POA: Diagnosis not present

## 2021-10-19 DIAGNOSIS — M1711 Unilateral primary osteoarthritis, right knee: Secondary | ICD-10-CM | POA: Diagnosis not present

## 2021-10-19 DIAGNOSIS — I10 Essential (primary) hypertension: Secondary | ICD-10-CM | POA: Diagnosis not present

## 2021-10-19 DIAGNOSIS — R6889 Other general symptoms and signs: Secondary | ICD-10-CM | POA: Diagnosis not present

## 2021-10-19 DIAGNOSIS — Z0000 Encounter for general adult medical examination without abnormal findings: Secondary | ICD-10-CM | POA: Diagnosis not present

## 2021-10-24 DIAGNOSIS — H524 Presbyopia: Secondary | ICD-10-CM | POA: Diagnosis not present

## 2021-10-24 DIAGNOSIS — R6889 Other general symptoms and signs: Secondary | ICD-10-CM | POA: Diagnosis not present

## 2021-10-24 DIAGNOSIS — Z0100 Encounter for examination of eyes and vision without abnormal findings: Secondary | ICD-10-CM | POA: Diagnosis not present

## 2021-10-30 DIAGNOSIS — Z20822 Contact with and (suspected) exposure to covid-19: Secondary | ICD-10-CM | POA: Diagnosis not present

## 2021-10-30 DIAGNOSIS — R070 Pain in throat: Secondary | ICD-10-CM | POA: Diagnosis not present

## 2021-10-30 DIAGNOSIS — J069 Acute upper respiratory infection, unspecified: Secondary | ICD-10-CM | POA: Diagnosis not present
